# Patient Record
Sex: Male | Born: 1935 | Marital: Single | Smoking: Former smoker
Health system: Southern US, Community
[De-identification: ages and names within clinical notes are randomized; demographics above are authoritative.]

## PROBLEM LIST (undated history)

## (undated) DIAGNOSIS — I6529 Occlusion and stenosis of unspecified carotid artery: Secondary | ICD-10-CM

## (undated) DIAGNOSIS — I714 Abdominal aortic aneurysm, without rupture: Secondary | ICD-10-CM

## (undated) DIAGNOSIS — E785 Hyperlipidemia, unspecified: Secondary | ICD-10-CM

## (undated) DIAGNOSIS — C801 Malignant (primary) neoplasm, unspecified: Secondary | ICD-10-CM

## (undated) DIAGNOSIS — I350 Nonrheumatic aortic (valve) stenosis: Secondary | ICD-10-CM

## (undated) DIAGNOSIS — I2510 Atherosclerotic heart disease of native coronary artery without angina pectoris: Secondary | ICD-10-CM

## (undated) DIAGNOSIS — I10 Essential (primary) hypertension: Secondary | ICD-10-CM

## (undated) DIAGNOSIS — I429 Cardiomyopathy, unspecified: Secondary | ICD-10-CM

## (undated) HISTORY — DX: Abdominal aortic aneurysm, without rupture: I71.4

## (undated) HISTORY — PX: KNEE SURGERY: SHX244

## (undated) HISTORY — DX: Occlusion and stenosis of unspecified carotid artery: I65.29

## (undated) HISTORY — DX: Essential (primary) hypertension: I10

## (undated) HISTORY — DX: Cardiomyopathy, unspecified: I42.9

## (undated) HISTORY — DX: Atherosclerotic heart disease of native coronary artery without angina pectoris: I25.10

## (undated) HISTORY — DX: Nonrheumatic aortic (valve) stenosis: I35.0

## (undated) HISTORY — PX: CARDIAC CATHETERIZATION: SHX172

## (undated) HISTORY — DX: Malignant (primary) neoplasm, unspecified: C80.1

## (undated) HISTORY — DX: Hyperlipidemia, unspecified: E78.5

## (undated) HISTORY — PX: COLONOSCOPY: SHX174

## (undated) HISTORY — PX: CATARACT EXTRACTION: SUR2

## (undated) HISTORY — PX: OTHER SURGICAL HISTORY: SHX169

---

## 2003-03-30 HISTORY — PX: CAROTID ENDARTERECTOMY: SUR193

## 2003-08-22 ENCOUNTER — Other Ambulatory Visit: Payer: Self-pay

## 2006-03-29 HISTORY — PX: CORONARY ARTERY BYPASS GRAFT: SHX141

## 2006-05-12 ENCOUNTER — Ambulatory Visit: Payer: Self-pay | Admitting: Cardiothoracic Surgery

## 2006-07-28 ENCOUNTER — Ambulatory Visit: Payer: Self-pay | Admitting: Cardiothoracic Surgery

## 2008-03-29 DIAGNOSIS — I714 Abdominal aortic aneurysm, without rupture, unspecified: Secondary | ICD-10-CM

## 2008-03-29 HISTORY — DX: Abdominal aortic aneurysm, without rupture: I71.4

## 2008-03-29 HISTORY — DX: Abdominal aortic aneurysm, without rupture, unspecified: I71.40

## 2009-03-06 ENCOUNTER — Encounter: Admission: RE | Admit: 2009-03-06 | Discharge: 2009-03-06 | Payer: Self-pay | Admitting: Neurosurgery

## 2010-08-14 NOTE — Consult Note (Signed)
NAME:  Marcus Jenkins, Marcus Jenkins NO.:  [id number]   MEDICAL RECORD NO.:  [id number]           PATIENT TYPE:   LOCATION:                                 FACILITY:   PHYSICIAN:  Sheliah Plane, MD         DATE OF BIRTH:   DATE OF CONSULTATION:  DATE OF DISCHARGE:                                 CONSULTATION   CARDIAC SURGERY OFFICE CONSULTATION   FOLLOWUP CARDIOLOGIST:  Dr. Domingo Sep.   PRIMARY CARE PHYSICIAN:  Dr. Jerl Mina.   REASON FOR CONSULTATION:  Coronary artery disease.   HISTORY OF PRESENT ILLNESS:  The patient is a 77 year old male who has  recently been followed by Dr. Domingo Sep with a complaint of increasing  fatigue.  He has a past history of hypertension and hyperlipidemia on  statin therapy.  He noted increasing fatigue with exertion over the past  several months.  He attributed this to hydrochlorothiazide and had some  improvement with discontinuation of hydrochlorothiazide.  However,  because of the patient's symptoms, Dr. Domingo Sep continued with a cardiac  workup including a Myoview stress test that showed evidence of moderate,  severe ischemia in the distribution of the right coronary artery.  He  also has exertional dyspnea which is unchanged recently.  He has a long  smoking history.  Because of his positive stress test, cardiac  catheterization was performed by Dr. Jenne Campus at [facility] and he  is now a consideration for angioplasty, was discussed; however, because  of the anatomy, it was not felt that this would be adequate and he is  referred for consideration of bypass surgery.  The patient has a history  of hypertension, history of hyperlipidemia, and denies diabetes.  He is  a smoker and smokes at least a pack a day for the past 50 years.   FAMILY HISTORY:  Both his mother and his father died in their 30s but of  unknown cause.  He notes he has 1 brother who died from emphysema and  alcoholic cirrhosis.  The patient denies any  previous stroke.  Denies  claudication.   PREVIOUS MEDICAL HISTORY:  Includes:  1. Hypertension.  2. Hyperlipidemia.  3. Cerebrovascular disease status post left carotid endarterectomy in      2005.  4. BPH status post TURP many years ago.  5. Chronic low back pain due to disk disease.  6. Continue tobacco abuse.   PREVIOUS SURGERY:  Included a left knee surgery.   SOCIAL HISTORY:  The patient is a retired [employer], is married with  4 children.  Drinks 4 to 5 cups of coffee a day.   MEDICATIONS:  Include:  1. Amlodipine 5 mg daily.  2. Meloxicam 7.5 mg b.i.d.  3. Lisinopril 20 mg daily.  4. Simvastatin 40 mg daily.  5. Glucerna 3 mg nightly.  6. Fish oil.  7. Ibuprofen 800 mg daily.  8. Aspirin 81 mg daily.   ALLERGIES:  Include TARKA CAUSES FATIGUE.  Denies other allergies.   PHYSICAL EXAM:  The patient appears his stated age.  His blood pressure is  144/70.  His pulse is 64 and regular.  Respiratory  rate is 16.  O2 sat is 95%.  The patient appears his staged age, 5 feet, 11 inches tall, weights 198  pounds.  Has no lymphadenopathy.  An old scar on his left knee from an  endarterectomy.  No jugular venous distention.  CARDIAC:  Regular rate and rhythm.  Soft, early systolic murmur at the  apex.  LUNGS:  Clear to auscultation.  His abdominal exam is unremarkable without palpable masses or  organomegaly.  His abdominal aorta does not feel palpably enlarged.  He  has femoral pulses bilaterally.  He has +1 DP and PT pulses bilaterally.  He has chronic venostasis  changes in both lower extremities through about the mid tibia.   LABORATORY FINDINGS:  Show an echocardiogram from April 27, 2006, and  normal LV function.  Ejection fraction is 60% to 65%.  Fibroelastic  degenerative changes of the aortic valve but without aortic stenosis or  aortic insufficiency.  Mild trivial mitral and tricuspid valvular  regurgitation.  Cardiac stress test on April 29, 2006, showed  1 to 1-  1/2 mm ST depression in the lateral inferior leads.  The patient had  evidence of moderate to severe ischemia in the inferior septal basilar,  inferior septal and mid inferior and apical inferior regions consistent  with a distribution in the right coronary.  Cardiac catheterization is  performed with sequential 80% to 90% stenosis of the right coronary  artery, 70% to 80% mid LAD, 40% proximal LAD, 60% posterior descending.  Overall ventricular function was preserved.   IMPRESSION:  Patient with at least significant two-vessel coronary  artery disease if not three with preserved left ventricular function and  a positive stress test.  I have discussed in detail with him to discuss  with the patient the need to stop smoking immediately and have  recommended that we proceed with coronary artery bypass grafting.  He is  willing to do this but prefers to wait a week or two.  He is totally  agreeable to proceeding on February 25.  The risks of surgery including  the risks of death, infection, stroke, myocardial infarction, bleeding,  and blood transfusion are all discussed with the patient in detail.  I  have asked him to stop his ibuprofen now, hold his lisinopril for 48  hours prior to surgery.  He is committed to at least trying to stop  smoking, especially the pulmonary risk in smokers have been discussed  with him in great detail.  He is willing to proceed.      Sheliah Plane, MD  Electronically Signed     EG/MEDQ  D:  05/12/2006  T:  05/12/2006  Job:  [id number]

## 2011-05-20 ENCOUNTER — Other Ambulatory Visit: Payer: Self-pay | Admitting: Gastroenterology

## 2011-05-20 DIAGNOSIS — K5730 Diverticulosis of large intestine without perforation or abscess without bleeding: Secondary | ICD-10-CM

## 2011-06-03 ENCOUNTER — Other Ambulatory Visit: Payer: Self-pay

## 2011-06-10 ENCOUNTER — Other Ambulatory Visit: Payer: Self-pay | Admitting: Gastroenterology

## 2011-06-10 ENCOUNTER — Ambulatory Visit
Admission: RE | Admit: 2011-06-10 | Discharge: 2011-06-10 | Disposition: A | Discharge status: Home / Self Care | Payer: Medicare Other | Source: Ambulatory Visit | Attending: Gastroenterology | Admitting: Gastroenterology

## 2011-06-10 DIAGNOSIS — K5730 Diverticulosis of large intestine without perforation or abscess without bleeding: Secondary | ICD-10-CM

## 2011-12-21 ENCOUNTER — Encounter: Payer: Self-pay | Admitting: Cardiovascular Disease

## 2011-12-21 ENCOUNTER — Ambulatory Visit (INDEPENDENT_AMBULATORY_CARE_PROVIDER_SITE_OTHER): Payer: Medicare Other | Admitting: Cardiovascular Disease

## 2011-12-21 VITALS — BP 138/80 | HR 85 | Ht 72.0 in | Wt 171.2 lb

## 2011-12-21 DIAGNOSIS — E785 Hyperlipidemia, unspecified: Secondary | ICD-10-CM

## 2011-12-21 DIAGNOSIS — R0602 Shortness of breath: Secondary | ICD-10-CM

## 2011-12-21 DIAGNOSIS — I2510 Atherosclerotic heart disease of native coronary artery without angina pectoris: Secondary | ICD-10-CM

## 2011-12-21 DIAGNOSIS — I10 Essential (primary) hypertension: Secondary | ICD-10-CM

## 2011-12-21 DIAGNOSIS — I714 Abdominal aortic aneurysm, without rupture, unspecified: Secondary | ICD-10-CM

## 2011-12-21 MED ORDER — LISINOPRIL 20 MG PO TABS: 20.0000 mg | ORAL_TABLET | Freq: Every day | ORAL | Status: DC

## 2011-12-21 NOTE — Patient Instructions (Addendum)
Stop Felodipine.  Start Lisinopril 20 mg once daily.  Labs to be done in 1 week.   Your physician has requested that you have an echocardiogram. Echocardiography is a painless test that uses sound waves to create images of your heart. It provides your doctor with information about the size and shape of your heart and how well your heart's chambers and valves are working. This procedure takes approximately one hour. There are no restrictions for this procedure.  Your physician has requested that you have an abdominal aorta duplex. During this test, an ultrasound is used to evaluate the aorta. Allow 30 minutes for this exam. Do not eat after midnight the day before and avoid carbonated beverages   Follow up after tests.

## 2011-12-27 ENCOUNTER — Other Ambulatory Visit: Payer: Self-pay

## 2011-12-27 ENCOUNTER — Other Ambulatory Visit (INDEPENDENT_AMBULATORY_CARE_PROVIDER_SITE_OTHER): Payer: Medicare Other

## 2011-12-27 DIAGNOSIS — R0602 Shortness of breath: Secondary | ICD-10-CM

## 2011-12-27 DIAGNOSIS — I359 Nonrheumatic aortic valve disorder, unspecified: Secondary | ICD-10-CM

## 2011-12-30 ENCOUNTER — Encounter (INDEPENDENT_AMBULATORY_CARE_PROVIDER_SITE_OTHER): Payer: Medicare Other

## 2011-12-30 DIAGNOSIS — I714 Abdominal aortic aneurysm, without rupture: Secondary | ICD-10-CM

## 2011-12-31 ENCOUNTER — Encounter: Payer: Self-pay | Admitting: Cardiovascular Disease

## 2011-12-31 DIAGNOSIS — R0602 Shortness of breath: Secondary | ICD-10-CM | POA: Insufficient documentation

## 2011-12-31 DIAGNOSIS — I714 Abdominal aortic aneurysm, without rupture: Secondary | ICD-10-CM | POA: Insufficient documentation

## 2011-12-31 DIAGNOSIS — E785 Hyperlipidemia, unspecified: Secondary | ICD-10-CM | POA: Insufficient documentation

## 2011-12-31 DIAGNOSIS — I10 Essential (primary) hypertension: Secondary | ICD-10-CM | POA: Insufficient documentation

## 2011-12-31 DIAGNOSIS — I2510 Atherosclerotic heart disease of native coronary artery without angina pectoris: Secondary | ICD-10-CM | POA: Insufficient documentation

## 2011-12-31 NOTE — Assessment & Plan Note (Signed)
The patient is having increased exertional dyspnea without chest pain. He has a cardiac murmur suggestive of aortic stenosis which does not seem to be severe by cardiac exam. However, given the change in his symptoms, I recommend an echocardiogram.

## 2011-12-31 NOTE — Progress Notes (Signed)
HPI  This is a 78 year old male who is here today to establish cardiovascular care. He is transferring from the [place] [facility]. He has known history of coronary artery disease status post two-vessel CABG in 2008 at [facility]. He is also known to have small to moderate size abdominal aortic aneurysm, previous left carotid endarterectomy, hypertension, hyperlipidemia and tobacco use. He also has known history of left bundle branch block with intermittent second degree Mobitz type I.  He denies any chest pain. However, he noticed increased exertional dyspnea as well as lower extremity edema. He denies dizziness, syncope or presyncope. Most recent echocardiogram was in February of 2011 which showed normal LV systolic function with mildly calcified aortic valve.  Allergies  Allergen Reactions  . Tarka (Trandolapril-Verapamil Hcl Er)      Current Outpatient Prescriptions on File Prior to Visit  Medication Sig Dispense Refill  . rosuvastatin (CRESTOR) 20 MG tablet Take 20 mg by mouth daily.      Marland Kitchen zolpidem (AMBIEN) 10 MG tablet Take 10 mg by mouth daily.      Marland Kitchen lisinopril (PRINIVIL,ZESTRIL) 20 MG tablet Take 1 tablet (20 mg total) by mouth daily.  60 tablet  6     Past Medical History  Diagnosis Date  . Carotid artery occlusion   . Coronary artery disease   . Hyperlipidemia   . AAA (abdominal aortic aneurysm) 2010  . Hypertension      Past Surgical History  Procedure Date  . Cardiac catheterization   . Carotid endarterectomy 2005    Dr. Michela Pitcher  . Knee surgery     left  . Cataract extraction   . Colonoscopy   . Surgical injection spine   . Burn facial cancer   . Coronary artery bypass graft 2008    [facility] x2: LIMA to LAD and SVG to RCA.     History reviewed. No pertinent family history.   History   Social History  . Marital Status: Single    Spouse Name: N/A    Number of Children: N/A  . Years of Education: N/A   Occupational History  . Not on file.    Social History Main Topics  . Smoking status: Current Every Day Smoker -- 0.5 packs/day for 70 years    Types: Cigarettes  . Smokeless tobacco: Not on file  . Alcohol Use: No  . Drug Use: No  . Sexually Active:    Other Topics Concern  . Not on file   Social History Narrative  . No narrative on file     ROS Constitutional: Negative for fever, chills, diaphoresis, activity change, appetite change and fatigue.  HENT: Negative for hearing loss, nosebleeds, congestion, sore throat, facial swelling, drooling, trouble swallowing, neck pain, voice change, sinus pressure and tinnitus.  Eyes: Negative for photophobia, pain, discharge and visual disturbance.  Respiratory: Negative for apnea, cough, chest tightness and wheezing.  Cardiovascular: Negative for chest pain, palpitations.  Gastrointestinal: Negative for nausea, vomiting, abdominal pain, diarrhea, constipation, blood in stool and abdominal distention.  Genitourinary: Negative for dysuria, urgency, frequency, hematuria and decreased urine volume.  Musculoskeletal: Negative for myalgias, back pain, joint swelling, arthralgias and gait problem.  Skin: Negative for color change, pallor, rash and wound.  Neurological: Negative for dizziness, tremors, seizures, syncope, speech difficulty, weakness, light-headedness, numbness and headaches.  Psychiatric/Behavioral: Negative for suicidal ideas, hallucinations, behavioral problems and agitation. The patient is not nervous/anxious.     PHYSICAL EXAM   BP 138/80  Pulse 85  Ht 6' (1.829 m)  Wt 171 lb 4 oz (77.678 kg)  BMI 23.23 kg/m2 Constitutional: He is oriented to person, place, and time. He appears well-developed and well-nourished. No distress.  HENT: No nasal discharge.  Head: Normocephalic and atraumatic.  Eyes: Pupils are equal and round. Right eye exhibits no discharge. Left eye exhibits no discharge.  Neck: Normal range of motion. Neck supple. No JVD present. No  thyromegaly present.  Cardiovascular: Normal rate, regular rhythm, normal heart sounds and. Exam reveals no gallop and no friction rub. There is a 2/6 systolic ejection murmur at the aortic area with preserved S2.  Pulmonary/Chest: Effort normal and breath sounds normal. No stridor. No respiratory distress. He has no wheezes. He has no rales. He exhibits no tenderness.  Abdominal: Soft. Bowel sounds are normal. He exhibits no distension. There is no tenderness. There is no rebound and no guarding.  Musculoskeletal: Normal range of motion. He exhibits +1 edema and no tenderness.  Neurological: He is alert and oriented to person, place, and time. Coordination normal.  Skin: Skin is warm and dry. No rash noted. He is not diaphoretic. No erythema. No pallor.  Psychiatric: He has a normal mood and affect. His behavior is normal. Judgment and thought content normal.       ZOX:WRUEA  Rhythm  -Left bundle branch block.   ABNORMAL    ASSESSMENT AND PLAN

## 2011-12-31 NOTE — Assessment & Plan Note (Signed)
The patient's blood pressure seems to be reasonable but we might have to be more aggressive with this due to abdominal aortic aneurysm. He is also having lower extremity edema which is likely due to calcium channel blocker. Thus, I recommend switching him from felodipine to lisinopril 20 mg once daily. Recommend basic metabolic profile in one week. Side effects were explained to him. It appears that he was started at some point a small dose carvedilol if he is no longer on the. That might be related to his conduction disease.

## 2011-12-31 NOTE — Assessment & Plan Note (Signed)
Continue treatment with Crestor. Target LDL is less than 70. 

## 2011-12-31 NOTE — Assessment & Plan Note (Signed)
No symptoms suggestive of angina. Continue medical therapy. 

## 2011-12-31 NOTE — Assessment & Plan Note (Signed)
This was small to moderate in size. No recent evaluation. I recommend a followup ultrasound.

## 2012-01-10 ENCOUNTER — Ambulatory Visit (INDEPENDENT_AMBULATORY_CARE_PROVIDER_SITE_OTHER): Payer: Medicare Other | Admitting: Cardiovascular Disease

## 2012-01-10 ENCOUNTER — Encounter: Payer: Self-pay | Admitting: Cardiovascular Disease

## 2012-01-10 VITALS — BP 118/64 | HR 87 | Ht 72.0 in | Wt 170.2 lb

## 2012-01-10 DIAGNOSIS — I714 Abdominal aortic aneurysm, without rupture: Secondary | ICD-10-CM

## 2012-01-10 DIAGNOSIS — I350 Nonrheumatic aortic (valve) stenosis: Secondary | ICD-10-CM

## 2012-01-10 DIAGNOSIS — I359 Nonrheumatic aortic valve disorder, unspecified: Secondary | ICD-10-CM

## 2012-01-10 DIAGNOSIS — R0602 Shortness of breath: Secondary | ICD-10-CM

## 2012-01-10 DIAGNOSIS — I2510 Atherosclerotic heart disease of native coronary artery without angina pectoris: Secondary | ICD-10-CM

## 2012-01-10 DIAGNOSIS — I10 Essential (primary) hypertension: Secondary | ICD-10-CM

## 2012-01-10 MED ORDER — LOSARTAN POTASSIUM 50 MG PO TABS: 50.0000 mg | ORAL_TABLET | Freq: Every day | ORAL | Status: DC

## 2012-01-10 NOTE — Patient Instructions (Addendum)
Labs today.  Stop taking Lisinopril.  Start Losartan 50 mg once daily.  Follow up in 6 months.

## 2012-01-11 LAB — BASIC METABOLIC PANEL
BUN/Creatinine Ratio: 14 (ref 10–22)
BUN: 15 mg/dL (ref 8–27)
CO2: 20 mmol/L (ref 19–28)
Chloride: 100 mmol/L (ref 97–108)
Glucose: 81 mg/dL (ref 65–99)
Potassium: 5.1 mmol/L (ref 3.5–5.2)

## 2012-01-13 ENCOUNTER — Encounter: Payer: Self-pay | Admitting: *Deleted

## 2012-01-18 ENCOUNTER — Encounter: Payer: Self-pay | Admitting: Cardiovascular Disease

## 2012-01-18 DIAGNOSIS — I350 Nonrheumatic aortic (valve) stenosis: Secondary | ICD-10-CM | POA: Insufficient documentation

## 2012-01-18 NOTE — Progress Notes (Signed)
HPI  This is a 78 year old male who is here today for a followup visit. He recently transferred care from the [facility] on heart and vascular Center. He has known history of coronary artery disease status post two-vessel CABG in 2008 at [facility]. He is also known to have small to moderate size abdominal aortic aneurysm, previous left carotid endarterectomy, hypertension, hyperlipidemia and tobacco use. He also has known history of left bundle branch block with intermittent second degree Mobitz type I.  He denies any chest pain. However, he noticed increased exertional dyspnea as well as lower extremity edema. He denies dizziness, syncope or presyncope. He did not tolerate treatment with carvedilol in the past due to worsening dyspnea and fatigue. He underwent an echocardiogram which showed mildly reduced LV systolic function with an EF of 40-45% with mild to moderate aortic stenosis. Aortic aneurysm was found to be 4.1 cm by duplex ultrasound. He denies any abdominal pain. During her last visit, I switched him from felodipine to lisinopril 20 mg once daily due to lower extremity edema. The edema resolved. However, he started having dry cough since starting the medication.  Allergies  Allergen Reactions  . Tarka (Trandolapril-Verapamil Hcl Er)      Current Outpatient Prescriptions on File Prior to Visit  Medication Sig Dispense Refill  . aspirin 81 MG tablet Take 81 mg by mouth daily.      . Cholecalciferol (VITAMIN D-3 PO) Take 400 Units by mouth daily.      . Coenzyme Q10 (CO Q 10) 100 MG CAPS Take by mouth daily.      Marland Kitchen HYDROcodone-acetaminophen (NORCO) 10-325 MG per tablet Take 1 tablet by mouth 3 (three) times daily.      . Multiple Vitamins-Minerals (CENTRUM SILVER PO) Take by mouth daily.      . Niacin (VITAMIN B-3 PO) Take by mouth daily.      . Omega-3 Fatty Acids (FISH OIL PO) Take by mouth daily.      . rosuvastatin (CRESTOR) 20 MG tablet Take 20 mg by mouth daily.      Marland Kitchen zolpidem  (AMBIEN) 10 MG tablet Take 10 mg by mouth daily.      Marland Kitchen losartan (COZAAR) 50 MG tablet Take 1 tablet (50 mg total) by mouth daily.  30 tablet  9     Past Medical History  Diagnosis Date  . Carotid artery occlusion   . Coronary artery disease   . Hyperlipidemia   . AAA (abdominal aortic aneurysm) 2010  . Hypertension   . Aortic stenosis     Mild to moderate with a mean gradient of 14 mm Hg and valve area of 1.1 (October of 2013)  . Cardiomyopathy     EF 40-45%     Past Surgical History  Procedure Date  . Cardiac catheterization   . Carotid endarterectomy 2005    Dr. Michela Pitcher  . Knee surgery     left  . Cataract extraction   . Colonoscopy   . Surgical injection spine   . Burn facial cancer   . Coronary artery bypass graft 2008    [facility] x2: LIMA to LAD and SVG to RCA.     History reviewed. No pertinent family history.   History   Social History  . Marital Status: Single    Spouse Name: N/A    Number of Children: N/A  . Years of Education: N/A   Occupational History  . Not on file.   Social History Main Topics  .  Smoking status: Current Every Day Smoker -- 0.5 packs/day for 70 years    Types: Cigarettes  . Smokeless tobacco: Not on file  . Alcohol Use: No  . Drug Use: No  . Sexually Active:    Other Topics Concern  . Not on file   Social History Narrative  . No narrative on file      PHYSICAL EXAM   BP 118/64  Pulse 87  Ht 6' (1.829 m)  Wt 170 lb 4 oz (77.225 kg)  BMI 23.09 kg/m2 Constitutional: He is oriented to person, place, and time. He appears well-developed and well-nourished. No distress.  HENT: No nasal discharge.  Head: Normocephalic and atraumatic.  Eyes: Pupils are equal and round. Right eye exhibits no discharge. Left eye exhibits no discharge.  Neck: Normal range of motion. Neck supple. No JVD present. No thyromegaly present.  Cardiovascular: Normal rate, regular rhythm, normal heart sounds and. Exam reveals no gallop and no friction  rub. There is a 2/6 systolic ejection murmur at the aortic area with preserved S2.  Pulmonary/Chest: Effort normal and breath sounds normal. No stridor. No respiratory distress. He has no wheezes. He has no rales. He exhibits no tenderness.  Abdominal: Soft. Bowel sounds are normal. He exhibits no distension. There is no tenderness. There is no rebound and no guarding.  Musculoskeletal: Normal range of motion. He exhibits +1 edema and no tenderness.  Neurological: He is alert and oriented to person, place, and time. Coordination normal.  Skin: Skin is warm and dry. No rash noted. He is not diaphoretic. No erythema. No pallor.  Psychiatric: He has a normal mood and affect. His behavior is normal. Judgment and thought content normal.        ASSESSMENT AND PLAN

## 2012-01-18 NOTE — Assessment & Plan Note (Signed)
His blood pressure is well controlled and lisinopril. However, he is having dry cough. Thus, I will switch him to losartan 50 mg once daily. I will check basic metabolic profile today.

## 2012-01-18 NOTE — Assessment & Plan Note (Signed)
This is likely multifactorial due to mild cardiomyopathy and some degree of aortic stenosis. He did not tolerate Coreg in the past possibly due to his conduction disease.

## 2012-01-18 NOTE — Assessment & Plan Note (Signed)
No symptoms suggestive of angina. Continue medical therapy. 

## 2012-01-18 NOTE — Assessment & Plan Note (Signed)
This is mild to moderate by echocardiogram. I recommend a repeat echocardiogram in one year. The mean gradient was not high. However, the valve area was lower than expected.

## 2012-01-18 NOTE — Assessment & Plan Note (Addendum)
This is still small to moderate in size at 4.1 cm. I recommend a followup study in one year. The iliac arteries were mildly dilated but not aneurysmal.

## 2012-03-10 ENCOUNTER — Other Ambulatory Visit: Payer: Self-pay | Admitting: Oncology

## 2012-03-17 ENCOUNTER — Ambulatory Visit (INDEPENDENT_AMBULATORY_CARE_PROVIDER_SITE_OTHER): Payer: Medicare Other | Admitting: Cardiovascular Disease

## 2012-03-17 ENCOUNTER — Encounter: Payer: Self-pay | Admitting: Cardiovascular Disease

## 2012-03-17 VITALS — BP 130/78 | HR 76 | Ht 69.0 in | Wt 169.2 lb

## 2012-03-17 DIAGNOSIS — R0602 Shortness of breath: Secondary | ICD-10-CM

## 2012-03-17 DIAGNOSIS — I10 Essential (primary) hypertension: Secondary | ICD-10-CM

## 2012-03-17 DIAGNOSIS — I350 Nonrheumatic aortic (valve) stenosis: Secondary | ICD-10-CM

## 2012-03-17 DIAGNOSIS — I359 Nonrheumatic aortic valve disorder, unspecified: Secondary | ICD-10-CM

## 2012-03-17 DIAGNOSIS — I2510 Atherosclerotic heart disease of native coronary artery without angina pectoris: Secondary | ICD-10-CM

## 2012-03-17 NOTE — Patient Instructions (Addendum)
Continue same medications.  Follow up in 3 months.  

## 2012-03-17 NOTE — Assessment & Plan Note (Signed)
This is likely multifactorial due to lung cancer, mild cardiomyopathy and some degree of aortic stenosis. I do not see significant change in his cardiac status. He has chronic left bundle branch block.

## 2012-03-17 NOTE — Assessment & Plan Note (Signed)
Blood pressure is well-controlled on losartan.  

## 2012-03-17 NOTE — Assessment & Plan Note (Signed)
This is mild to moderate by echocardiogram. Further surveillance would be determined based on his response to treatment of stage IV lung cancer.

## 2012-03-17 NOTE — Assessment & Plan Note (Signed)
No symptoms of chest pain. Continue medical therapy.

## 2012-03-17 NOTE — Progress Notes (Signed)
HPI  This is a 78 year old male who is here today for a followup visit. He has known history of coronary artery disease status post two-vessel CABG in 2008 at [facility]. He is also known to have small to moderate size abdominal aortic aneurysm, previous left carotid endarterectomy, hypertension, hyperlipidemia and tobacco use. He also has known history of left bundle branch block with intermittent second degree Mobitz type I.  He did not tolerate treatment with carvedilol in the past due to worsening dyspnea and fatigue. Recent echocardiogram showed mildly reduced LV systolic function with an EF of 40-45% with mild to moderate aortic stenosis. Aortic aneurysm was found to be 4.1 cm by duplex ultrasound.  Since his last visit, he was diagnosed with stage IV squamous cell carcinoma of the lung. He was initiated on chemotherapy by Dr. Orlie Dakin. The patient complains of being more fatigued than before with slight shortness of breath. He has no chest pain. No orthopnea, PND or lower extremity edema.  Allergies  Allergen Reactions  . Tarka (Trandolapril-Verapamil Hcl Er)      Current Outpatient Prescriptions on File Prior to Visit  Medication Sig Dispense Refill  . aspirin 81 MG tablet Take 81 mg by mouth daily.      . Cholecalciferol (VITAMIN D-3 PO) Take 400 Units by mouth daily.      . Coenzyme Q10 (CO Q 10) 100 MG CAPS Take by mouth daily.      Marland Kitchen HYDROcodone-acetaminophen (NORCO) 10-325 MG per tablet Take 1 tablet by mouth 3 (three) times daily.      Marland Kitchen losartan (COZAAR) 50 MG tablet Take 1 tablet (50 mg total) by mouth daily.  30 tablet  9  . Multiple Vitamins-Minerals (CENTRUM SILVER PO) Take by mouth daily.      . Niacin (VITAMIN B-3 PO) Take by mouth daily.      . Omega-3 Fatty Acids (FISH OIL PO) Take by mouth daily.      . rosuvastatin (CRESTOR) 20 MG tablet Take 20 mg by mouth daily.      Marland Kitchen zolpidem (AMBIEN) 10 MG tablet Take 10 mg by mouth daily.         Past Medical History    Diagnosis Date  . Carotid artery occlusion   . Coronary artery disease   . Hyperlipidemia   . AAA (abdominal aortic aneurysm) 2010  . Hypertension   . Aortic stenosis     Mild to moderate with a mean gradient of 14 mm Hg and valve area of 1.1 (October of 2013)  . Cardiomyopathy     EF 40-45%  . Cancer     lung cancer     Past Surgical History  Procedure Date  . Cardiac catheterization   . Carotid endarterectomy 2005    Dr. Michela Pitcher  . Knee surgery     left  . Cataract extraction   . Colonoscopy   . Surgical injection spine   . Burn facial cancer   . Coronary artery bypass graft 2008    [facility] x2: LIMA to LAD and SVG to RCA.  Marland Kitchen Porta cath     CHEST     History reviewed. No pertinent family history.   History   Social History  . Marital Status: Single    Spouse Name: N/A    Number of Children: N/A  . Years of Education: N/A   Occupational History  . Not on file.   Social History Main Topics  . Smoking status: Current Every Day  Smoker -- 0.5 packs/day for 70 years    Types: Cigarettes  . Smokeless tobacco: Not on file  . Alcohol Use: No  . Drug Use: No  . Sexually Active:    Other Topics Concern  . Not on file   Social History Narrative  . No narrative on file      PHYSICAL EXAM   BP 130/78  Pulse 76  Ht 5\' 9"  (1.753 m)  Wt 169 lb 4 oz (76.771 kg)  BMI 24.99 kg/m2 Constitutional: He is oriented to person, place, and time. He appears well-developed and well-nourished. No distress.  HENT: No nasal discharge.  Head: Normocephalic and atraumatic.  Eyes: Pupils are equal and round. Right eye exhibits no discharge. Left eye exhibits no discharge.  Neck: Normal range of motion. Neck supple. No JVD present. No thyromegaly present.  Cardiovascular: Normal rate, regular rhythm, normal heart sounds and. Exam reveals no gallop and no friction rub. There is a 2/6 systolic ejection murmur at the aortic area with preserved S2.  Pulmonary/Chest: Effort normal and  breath sounds normal. No stridor. No respiratory distress. He has no wheezes. He has no rales. He exhibits no tenderness.  Abdominal: Soft. Bowel sounds are normal. He exhibits no distension. There is no tenderness. There is no rebound and no guarding.  Musculoskeletal: Normal range of motion. He exhibits trace edema and no tenderness.  Neurological: He is alert and oriented to person, place, and time. Coordination normal.  Skin: Skin is warm and dry. No rash noted. He is not diaphoretic. No erythema. No pallor.  Psychiatric: He has a normal mood and affect. His behavior is normal. Judgment and thought content normal.     EKG: Sinus  Rhythm with first degree AV block -Left bundle branch block.   ABNORMAL     ASSESSMENT AND PLAN

## 2012-05-13 ENCOUNTER — Other Ambulatory Visit: Payer: Self-pay

## 2012-07-17 ENCOUNTER — Encounter: Payer: Self-pay | Admitting: Cardiovascular Disease

## 2012-07-17 ENCOUNTER — Ambulatory Visit (INDEPENDENT_AMBULATORY_CARE_PROVIDER_SITE_OTHER): Payer: Medicare Other | Admitting: Cardiovascular Disease

## 2012-07-17 ENCOUNTER — Ambulatory Visit: Payer: Medicare Other | Admitting: Cardiovascular Disease

## 2012-07-17 VITALS — BP 112/80 | HR 80 | Ht 69.0 in | Wt 165.5 lb

## 2012-07-17 DIAGNOSIS — I2510 Atherosclerotic heart disease of native coronary artery without angina pectoris: Secondary | ICD-10-CM

## 2012-07-17 DIAGNOSIS — I350 Nonrheumatic aortic (valve) stenosis: Secondary | ICD-10-CM

## 2012-07-17 DIAGNOSIS — I10 Essential (primary) hypertension: Secondary | ICD-10-CM

## 2012-07-17 DIAGNOSIS — I714 Abdominal aortic aneurysm, without rupture: Secondary | ICD-10-CM

## 2012-07-17 DIAGNOSIS — I359 Nonrheumatic aortic valve disorder, unspecified: Secondary | ICD-10-CM

## 2012-07-17 NOTE — Progress Notes (Signed)
HPI  This is a 79 year old male who is here today for a followup visit. He has known history of coronary artery disease status post two-vessel CABG in 2008 at [facility]. He is also known to have small to moderate size abdominal aortic aneurysm, previous left carotid endarterectomy, hypertension, hyperlipidemia and tobacco use. He has left bundle branch block with intermittent second degree Mobitz type I.  He did not tolerate treatment with carvedilol in the past due to worsening dyspnea and fatigue. Most recent echocardiogram in September of 2013 showed reduced LV systolic function with an EF of 40-45% with mild to moderate aortic stenosis. Aortic aneurysm was found to be 4.1 cm by duplex ultrasound.  He continues to receive chemotherapy for stage IV squamous cell carcinoma of the lung.  He has been doing reasonably well. He denies any chest pain. He does feel tired and fatigue.  Allergies  Allergen Reactions  . Tarka (Trandolapril-Verapamil Hcl Er)      Current Outpatient Prescriptions on File Prior to Visit  Medication Sig Dispense Refill  . aspirin 81 MG tablet Take 81 mg by mouth daily.      . Cholecalciferol (VITAMIN D-3 PO) Take 400 Units by mouth daily.      . Coenzyme Q10 (CO Q 10) 100 MG CAPS Take by mouth daily.      Marland Kitchen HYDROcodone-acetaminophen (NORCO) 10-325 MG per tablet Take 1 tablet by mouth 3 (three) times daily.      Marland Kitchen losartan (COZAAR) 50 MG tablet Take 1 tablet (50 mg total) by mouth daily.  30 tablet  9  . Multiple Vitamins-Minerals (CENTRUM SILVER PO) Take by mouth daily.      . rosuvastatin (CRESTOR) 20 MG tablet Take 20 mg by mouth daily.      Marland Kitchen zolpidem (AMBIEN) 10 MG tablet Take 10 mg by mouth daily.       No current facility-administered medications on file prior to visit.     Past Medical History  Diagnosis Date  . Carotid artery occlusion   . Coronary artery disease   . Hyperlipidemia   . AAA (abdominal aortic aneurysm) 2010  . Hypertension   . Aortic  stenosis     Mild to moderate with a mean gradient of 14 mm Hg and valve area of 1.1 (October of 2013)  . Cardiomyopathy     EF 40-45%  . Cancer     lung cancer     Past Surgical History  Procedure Laterality Date  . Cardiac catheterization    . Carotid endarterectomy  2005    Dr. Michela Pitcher  . Knee surgery      left  . Cataract extraction    . Colonoscopy    . Surgical injection spine    . Burn facial cancer    . Coronary artery bypass graft  2008    [facility] x2: LIMA to LAD and SVG to RCA.  Marland Kitchen Porta cath      CHEST     History reviewed. No pertinent family history.   History   Social History  . Marital Status: Single    Spouse Name: N/A    Number of Children: N/A  . Years of Education: N/A   Occupational History  . Not on file.   Social History Main Topics  . Smoking status: Current Every Day Smoker -- 0.50 packs/day for 70 years    Types: Cigarettes  . Smokeless tobacco: Not on file  . Alcohol Use: No  . Drug Use:  No  . Sexually Active:    Other Topics Concern  . Not on file   Social History Narrative  . No narrative on file      PHYSICAL EXAM   BP 112/80  Pulse 80  Ht 5\' 9"  (1.753 m)  Wt 165 lb 8 oz (75.07 kg)  BMI 24.43 kg/m2 Constitutional: He is oriented to person, place, and time. He appears well-developed and well-nourished. No distress.  HENT: No nasal discharge.  Head: Normocephalic and atraumatic.  Eyes: Pupils are equal and round. Right eye exhibits no discharge. Left eye exhibits no discharge.  Neck: Normal range of motion. Neck supple. No JVD present. No thyromegaly present.  Cardiovascular: Normal rate, regular rhythm, normal heart sounds and. Exam reveals no gallop and no friction rub. There is a 2/6 systolic ejection murmur at the aortic area with preserved S2.  Pulmonary/Chest: Effort normal and breath sounds normal. No stridor. No respiratory distress. He has no wheezes. He has no rales. He exhibits no tenderness.  Abdominal: Soft.  Bowel sounds are normal. He exhibits no distension. There is no tenderness. There is no rebound and no guarding.  Musculoskeletal: Normal range of motion. He exhibits trace edema and no tenderness.  Neurological: He is alert and oriented to person, place, and time. Coordination normal.  Skin: Skin is warm and dry. No rash noted. He is not diaphoretic. No erythema. No pallor.  Psychiatric: He has a normal mood and affect. His behavior is normal. Judgment and thought content normal.     EKG: Sinus  Rhythm with first degree AV block -Left bundle branch block.   ABNORMAL     ASSESSMENT AND PLAN

## 2012-07-17 NOTE — Assessment & Plan Note (Signed)
He is stable with no symptoms suggestive of angina. Continue medical therapy. 

## 2012-07-17 NOTE — Assessment & Plan Note (Signed)
He is scheduled for aortic duplex ultrasound in May of this year to ensure stability. Further surveillance will be determined based on his response to treatment to cancer.

## 2012-07-17 NOTE — Assessment & Plan Note (Signed)
Blood pressure is well controlled on current medications. 

## 2012-07-17 NOTE — Assessment & Plan Note (Signed)
This is mild to moderate by echocardiogram in September of 2013. Further surveillance would be determined based on his response to treatment of stage IV lung cancer.

## 2012-07-17 NOTE — Patient Instructions (Addendum)
Continue same medications.  Follow up in 6 months.  

## 2012-08-02 ENCOUNTER — Encounter (INDEPENDENT_AMBULATORY_CARE_PROVIDER_SITE_OTHER): Payer: Medicare Other

## 2012-08-02 DIAGNOSIS — I714 Abdominal aortic aneurysm, without rupture: Secondary | ICD-10-CM

## 2012-08-02 DIAGNOSIS — I700 Atherosclerosis of aorta: Secondary | ICD-10-CM

## 2012-11-01 ENCOUNTER — Other Ambulatory Visit: Payer: Self-pay

## 2013-01-03 ENCOUNTER — Ambulatory Visit (INDEPENDENT_AMBULATORY_CARE_PROVIDER_SITE_OTHER): Payer: Medicare Other | Admitting: Cardiovascular Disease

## 2013-01-03 ENCOUNTER — Encounter: Payer: Self-pay | Admitting: Cardiovascular Disease

## 2013-01-03 VITALS — BP 130/82 | HR 71 | Ht 69.0 in | Wt 165.5 lb

## 2013-01-03 DIAGNOSIS — I714 Abdominal aortic aneurysm, without rupture, unspecified: Secondary | ICD-10-CM

## 2013-01-03 DIAGNOSIS — I350 Nonrheumatic aortic (valve) stenosis: Secondary | ICD-10-CM

## 2013-01-03 DIAGNOSIS — I2510 Atherosclerotic heart disease of native coronary artery without angina pectoris: Secondary | ICD-10-CM

## 2013-01-03 DIAGNOSIS — R0602 Shortness of breath: Secondary | ICD-10-CM

## 2013-01-03 DIAGNOSIS — N529 Male erectile dysfunction, unspecified: Secondary | ICD-10-CM | POA: Insufficient documentation

## 2013-01-03 DIAGNOSIS — I359 Nonrheumatic aortic valve disorder, unspecified: Secondary | ICD-10-CM

## 2013-01-03 MED ORDER — SILDENAFIL CITRATE 25 MG PO TABS: 25.0000 mg | ORAL_TABLET | Freq: Every day | ORAL | Status: DC | PRN

## 2013-01-03 NOTE — Progress Notes (Signed)
HPI  This is a 79 year old male who is here today for a followup visit. He has known history of coronary artery disease status post two-vessel CABG in 2008 at [facility]. He is also known to have small to moderate size abdominal aortic aneurysm, previous left carotid endarterectomy, hypertension, hyperlipidemia, mild to moderate aortic stenosis and tobacco use. He has left bundle branch block with intermittent second degree Mobitz type I.  He did not tolerate treatment with carvedilol in the past due to worsening dyspnea and fatigue. Most recent echocardiogram in September of 2013 showed reduced LV systolic function with an EF of 40-45% with mild to moderate aortic stenosis. Aortic aneurysm was found to be 4.1 cm by duplex ultrasound.  He continues to receive chemotherapy for stage IV squamous cell carcinoma of the lung.  He has been doing reasonably well. He denies any chest pain. Shortness of breath is at baseline.  Allergies  Allergen Reactions  . Tarka [Trandolapril-Verapamil Hcl Er]      Current Outpatient Prescriptions on File Prior to Visit  Medication Sig Dispense Refill  . aspirin 81 MG tablet Take 81 mg by mouth daily.      . Cholecalciferol (VITAMIN D-3 PO) Take 400 Units by mouth daily.      . Coenzyme Q10 (CO Q 10) 100 MG CAPS Take by mouth daily.      Marland Kitchen Dextromethorphan-Guaifenesin (VICKS DAYQUIL MUCUS CONTROL DM PO) Take by mouth as needed.      Boris Lown Oil 500 MG CAPS Take by mouth daily.      Marland Kitchen losartan (COZAAR) 50 MG tablet Take 1 tablet (50 mg total) by mouth daily.  30 tablet  9  . Multiple Vitamins-Minerals (CENTRUM SILVER PO) Take by mouth daily.      Marland Kitchen oxyCODONE (OXYCONTIN) 10 MG 12 hr tablet Take 10 mg by mouth as needed for pain.      . rosuvastatin (CRESTOR) 20 MG tablet Take 20 mg by mouth daily.      Marland Kitchen zolpidem (AMBIEN) 10 MG tablet Take 10 mg by mouth daily.       No current facility-administered medications on file prior to visit.     Past Medical History    Diagnosis Date  . Carotid artery occlusion   . Coronary artery disease   . Hyperlipidemia   . AAA (abdominal aortic aneurysm) 2010  . Hypertension   . Aortic stenosis     Mild to moderate with a mean gradient of 14 mm Hg and valve area of 1.1 (October of 2013)  . Cardiomyopathy     EF 40-45%  . Cancer     lung cancer     Past Surgical History  Procedure Laterality Date  . Cardiac catheterization    . Carotid endarterectomy  2005    Dr. Michela Pitcher  . Knee surgery      left  . Cataract extraction    . Colonoscopy    . Surgical injection spine    . Burn facial cancer    . Coronary artery bypass graft  2008    [facility] x2: LIMA to LAD and SVG to RCA.  Marland Kitchen Porta cath      CHEST     Family History  Problem Relation Age of Onset  . Family history unknown: Yes     History   Social History  . Marital Status: Single    Spouse Name: N/A    Number of Children: N/A  . Years of Education: N/A  Occupational History  . Not on file.   Social History Main Topics  . Smoking status: Current Every Day Smoker -- 0.50 packs/day for 70 years    Types: Cigarettes  . Smokeless tobacco: Not on file  . Alcohol Use: No  . Drug Use: No  . Sexual Activity: Not on file   Other Topics Concern  . Not on file   Social History Narrative  . No narrative on file      PHYSICAL EXAM   BP 130/82  Pulse 71  Ht 5\' 9"  (1.753 m)  Wt 165 lb 8 oz (75.07 kg)  BMI 24.43 kg/m2 Constitutional: He is oriented to person, place, and time. He appears well-developed and well-nourished. No distress.  HENT: No nasal discharge.  Head: Normocephalic and atraumatic.  Eyes: Pupils are equal and round. Right eye exhibits no discharge. Left eye exhibits no discharge.  Neck: Normal range of motion. Neck supple. No JVD present. No thyromegaly present.  Cardiovascular: Normal rate, regular rhythm, normal heart sounds and. Exam reveals no gallop and no friction rub. There is a 2/6 systolic ejection murmur at the  aortic area with preserved S2.  Pulmonary/Chest: Effort normal and breath sounds normal. No stridor. No respiratory distress. He has no wheezes. He has no rales. He exhibits no tenderness.  Abdominal: Soft. Bowel sounds are normal. He exhibits no distension. There is no tenderness. There is no rebound and no guarding.  Musculoskeletal: Normal range of motion. He exhibits trace edema and no tenderness.  Neurological: He is alert and oriented to person, place, and time. Coordination normal.  Skin: Skin is warm and dry. No rash noted. He is not diaphoretic. No erythema. No pallor.  Psychiatric: He has a normal mood and affect. His behavior is normal. Judgment and thought content normal.     EKG: Sinus rhythm with frequent PACs and PVCs, left ventricular hypertrophy with QRS widening   ASSESSMENT AND PLAN

## 2013-01-03 NOTE — Assessment & Plan Note (Signed)
He is stable from a cardiac standpoint with no symptoms suggestive of angina. Continue medical therapy. 

## 2013-01-03 NOTE — Assessment & Plan Note (Signed)
I will obtain aortic ultrasound next year.

## 2013-01-03 NOTE — Assessment & Plan Note (Signed)
A followup echocardiogram will be requested next year.

## 2013-01-03 NOTE — Patient Instructions (Addendum)
Continue same medications.  Can use Viagra 25 mg as need for erectile dysfunction.   Your physician wants you to follow-up in: 6 months.  You will receive a reminder letter in the mail two months in advance. If you don't receive a letter, please call our office to schedule the follow-up appointment.

## 2013-01-03 NOTE — Assessment & Plan Note (Signed)
The patient requested treatment for erectile dysfunction. His cardiac status has been stable and he is not on nitroglycerin. Thus, I agreed to provide him with a small dose Viagra 25 mg as needed.

## 2013-02-01 ENCOUNTER — Other Ambulatory Visit: Payer: Self-pay

## 2013-04-10 ENCOUNTER — Encounter (INDEPENDENT_AMBULATORY_CARE_PROVIDER_SITE_OTHER): Payer: Medicare Other

## 2013-04-10 DIAGNOSIS — I714 Abdominal aortic aneurysm, without rupture, unspecified: Secondary | ICD-10-CM

## 2013-04-20 ENCOUNTER — Ambulatory Visit (INDEPENDENT_AMBULATORY_CARE_PROVIDER_SITE_OTHER): Payer: Medicare Other | Admitting: Cardiovascular Disease

## 2013-04-20 ENCOUNTER — Encounter: Payer: Self-pay | Admitting: Cardiovascular Disease

## 2013-04-20 VITALS — BP 155/80 | HR 90 | Ht 69.0 in | Wt 165.0 lb

## 2013-04-20 DIAGNOSIS — I10 Essential (primary) hypertension: Secondary | ICD-10-CM

## 2013-04-20 DIAGNOSIS — I714 Abdominal aortic aneurysm, without rupture, unspecified: Secondary | ICD-10-CM

## 2013-04-20 DIAGNOSIS — I350 Nonrheumatic aortic (valve) stenosis: Secondary | ICD-10-CM

## 2013-04-20 DIAGNOSIS — I359 Nonrheumatic aortic valve disorder, unspecified: Secondary | ICD-10-CM

## 2013-04-20 DIAGNOSIS — R079 Chest pain, unspecified: Secondary | ICD-10-CM

## 2013-04-20 DIAGNOSIS — I2510 Atherosclerotic heart disease of native coronary artery without angina pectoris: Secondary | ICD-10-CM

## 2013-04-20 NOTE — Progress Notes (Signed)
HPI  This is a 80 year old male who is here today for a followup visit. He has known history of coronary artery disease status post two-vessel CABG in 2008 at [facility]. He is also known to have moderate size abdominal aortic aneurysm, previous left carotid endarterectomy, hypertension, hyperlipidemia, mild to moderate aortic stenosis and tobacco use. He has left bundle branch block with intermittent second degree Mobitz type I.  He did not tolerate treatment with carvedilol in the past due to worsening dyspnea and fatigue. Most recent echocardiogram in September of 2013 showed reduced LV systolic function with an EF of 40-45% with mild to moderate aortic stenosis. He continues to receive chemotherapy for stage IV squamous cell carcinoma of the lung. He still has 2 more sessions. His response to cancer treatment is still not known. He has been doing reasonably well. He has had some worsening shortness of breath with chemotherapy. Recent abdominal ultrasound showed that the aortic aneurysm grew to 4.9 cm. He denies abdominal pain.  Allergies  Allergen Reactions  . Tarka [Trandolapril-Verapamil Hcl Er]      Current Outpatient Prescriptions on File Prior to Visit  Medication Sig Dispense Refill  . aspirin 81 MG tablet Take 81 mg by mouth daily.      . Cholecalciferol (VITAMIN D-3 PO) Take 400 Units by mouth daily.      . Coenzyme Q10 (CO Q 10) 100 MG CAPS Take by mouth daily.      Marland Kitchen Dextromethorphan-Guaifenesin (VICKS DAYQUIL MUCUS CONTROL DM PO) Take by mouth as needed.      Javier Docker Oil 500 MG CAPS Take by mouth daily.      Marland Kitchen losartan (COZAAR) 50 MG tablet Take 1 tablet (50 mg total) by mouth daily.  30 tablet  9  . Multiple Vitamins-Minerals (CENTRUM SILVER PO) Take by mouth daily.      Marland Kitchen oxyCODONE (OXYCONTIN) 10 MG 12 hr tablet Take 10 mg by mouth as needed for pain.      . rosuvastatin (CRESTOR) 20 MG tablet Take 20 mg by mouth daily.      Marland Kitchen zolpidem (AMBIEN) 10 MG tablet Take 10 mg by  mouth daily.       No current facility-administered medications on file prior to visit.     Past Medical History  Diagnosis Date  . Carotid artery occlusion   . Coronary artery disease   . Hyperlipidemia   . AAA (abdominal aortic aneurysm) 2010  . Hypertension   . Aortic stenosis     Mild to moderate with a mean gradient of 14 mm Hg and valve area of 1.1 (October of 2013)  . Cardiomyopathy     EF 40-45%  . Cancer     lung cancer     Past Surgical History  Procedure Laterality Date  . Cardiac catheterization    . Carotid endarterectomy  2005    Dr. Pat Patrick  . Knee surgery      left  . Cataract extraction    . Colonoscopy    . Surgical injection spine    . Burn facial cancer    . Coronary artery bypass graft  2008    [facility] x2: LIMA to LAD and SVG to RCA.  Marland Kitchen Porta cath      CHEST     Family History  Problem Relation Age of Onset  . Family history unknown: Yes     History   Social History  . Marital Status: Single  Spouse Name: N/A    Number of Children: N/A  . Years of Education: N/A   Occupational History  . Not on file.   Social History Main Topics  . Smoking status: Former Smoker -- 0.50 packs/day for 70 years    Types: Cigarettes  . Smokeless tobacco: Not on file  . Alcohol Use: No  . Drug Use: No  . Sexual Activity: Not on file   Other Topics Concern  . Not on file   Social History Narrative  . No narrative on file      PHYSICAL EXAM   BP 155/80  Pulse 90  Ht 5\' 9"  (1.753 m)  Wt 165 lb (74.844 kg)  BMI 24.36 kg/m2 Constitutional: He is oriented to person, place, and time. He appears well-developed and well-nourished. No distress.  HENT: No nasal discharge.  Head: Normocephalic and atraumatic.  Eyes: Pupils are equal and round. Right eye exhibits no discharge. Left eye exhibits no discharge.  Neck: Normal range of motion. Neck supple. No JVD present. No thyromegaly present.  Cardiovascular: Normal rate, regular rhythm, normal heart  sounds and. Exam reveals no gallop and no friction rub. There is a 2/6 systolic ejection murmur at the aortic area with preserved S2.  Pulmonary/Chest: Effort normal and breath sounds normal. No stridor. No respiratory distress. He has no wheezes. He has no rales. He exhibits no tenderness.  Abdominal: Soft. Bowel sounds are normal. He exhibits no distension. There is no tenderness. There is no rebound and no guarding.  Musculoskeletal: Normal range of motion. He exhibits trace edema and no tenderness.  Neurological: He is alert and oriented to person, place, and time. Coordination normal.  Skin: Skin is warm and dry. No rash noted. He is not diaphoretic. No erythema. No pallor.  Psychiatric: He has a normal mood and affect. His behavior is normal. Judgment and thought content normal.     EKG: Sinus  Rhythm with first degree AV block -Left bundle branch block.   ABNORMAL    ASSESSMENT AND PLAN

## 2013-04-20 NOTE — Patient Instructions (Signed)
Your physician has requested that you have an abdominal aorta duplex. During this test, an ultrasound is used to evaluate the aorta. Allow 30 minutes for this exam. Do not eat after midnight the day before and avoid carbonated beverages Please schedule in July.  Your physician wants you to follow-up in: 1 week after your test.  You will receive a reminder letter in the mail two months in advance. If you don't receive a letter, please call our office to schedule the follow-up appointment.   Your physician recommends that you continue on your current medications as directed. Please refer to the Current Medication list given to you today.

## 2013-04-21 ENCOUNTER — Encounter: Payer: Self-pay | Admitting: Cardiovascular Disease

## 2013-04-21 NOTE — Assessment & Plan Note (Signed)
Blood pressure is elevated today. However, he seems to be anxious. He reports that his recent blood pressure has been running around 115 to 117 mm mercury systolic.

## 2013-04-21 NOTE — Assessment & Plan Note (Signed)
A followup echocardiogram will be requested during his next visit.

## 2013-04-21 NOTE — Assessment & Plan Note (Signed)
His abdominal aortic aneurysm grew by more than 5 mm over the last 6 months. Currently it measures 4.9 cm. Ideally, this should be repaired now. However, he is getting chemotherapy for stage IV lung cancer. The long-term prognosis of his cancer is not known yet. Thus, I don't think it makes sense to proceed with treatment of his aortic aneurysm. I explained to him the risk of rupture. He understands this and also wants to wait and not do anything about this until his cancer treatment is finished. Repeat ultrasound in 6 months.

## 2013-04-21 NOTE — Assessment & Plan Note (Signed)
He had some worsening of dyspnea with chemotherapy. Likely related to his lung cancer.

## 2013-04-25 ENCOUNTER — Telehealth: Payer: Self-pay

## 2013-04-25 DIAGNOSIS — E785 Hyperlipidemia, unspecified: Secondary | ICD-10-CM

## 2013-04-25 DIAGNOSIS — R0989 Other specified symptoms and signs involving the circulatory and respiratory systems: Secondary | ICD-10-CM

## 2013-04-25 DIAGNOSIS — I10 Essential (primary) hypertension: Secondary | ICD-10-CM

## 2013-04-25 DIAGNOSIS — I25810 Atherosclerosis of coronary artery bypass graft(s) without angina pectoris: Secondary | ICD-10-CM

## 2013-04-25 NOTE — Telephone Encounter (Signed)
Pt wife states that pt right leg is swollen, painful to touch. Please call. Pt wife states she called the "nurse line" and they suggested she call pt PCP.      Patient stated she has a call into her PCP about this and she called [place] by mistake. Informed patient to call us back if she did not hear from pcp.

## 2013-04-25 NOTE — Telephone Encounter (Signed)
Pt wife states that pt right leg is swollen, painful to touch. Please call. Pt wife states she called the "nurse line" and they suggested she call pt PCP. Please call.

## 2013-04-30 MED ORDER — LOSARTAN POTASSIUM 50 MG PO TABS: 50.0000 mg | ORAL_TABLET | Freq: Every day | ORAL | Status: AC

## 2013-04-30 NOTE — Telephone Encounter (Signed)
Schedule right lower extremity venous duplex ASAP.

## 2013-04-30 NOTE — Telephone Encounter (Signed)
Called pt to check on him.  He reports that "it finally subsided" and he is "pretty sure that it was a blood clot". Pt reports that he did not see his PCP or any physician, as it went away on it's own. Discussed w/ pt the importance of addressing sx immediately. Pt would like to know if Dr. Fletcher Anon would like to order an u/s or any other tests to evaluate his legs, as he is ready to do so.  Please advise.  Thank you.

## 2013-04-30 NOTE — Telephone Encounter (Signed)
Spoke w/ pt and his wife, as they were both on the line.  He is agreeable to having the u/s and is scheduled tomorrow at 4:30. Pt has chemo tomorrow am and will call if this interferes.

## 2013-05-01 ENCOUNTER — Encounter (INDEPENDENT_AMBULATORY_CARE_PROVIDER_SITE_OTHER): Payer: Medicare Other

## 2013-05-01 DIAGNOSIS — R0989 Other specified symptoms and signs involving the circulatory and respiratory systems: Secondary | ICD-10-CM

## 2013-05-01 DIAGNOSIS — M7989 Other specified soft tissue disorders: Secondary | ICD-10-CM

## 2013-05-01 DIAGNOSIS — I25810 Atherosclerosis of coronary artery bypass graft(s) without angina pectoris: Secondary | ICD-10-CM

## 2013-05-01 DIAGNOSIS — I10 Essential (primary) hypertension: Secondary | ICD-10-CM

## 2013-05-01 DIAGNOSIS — E785 Hyperlipidemia, unspecified: Secondary | ICD-10-CM

## 2013-05-31 ENCOUNTER — Telehealth: Payer: Self-pay | Admitting: *Deleted

## 2013-05-31 DIAGNOSIS — E785 Hyperlipidemia, unspecified: Secondary | ICD-10-CM

## 2013-05-31 NOTE — Telephone Encounter (Signed)
Patient's wife called requesting a cholestrol blood test. Patient doesn't want to go throught pcp due to all the sickness. P

## 2013-05-31 NOTE — Telephone Encounter (Signed)
That's fine. Do fasting lipid and liver profile.

## 2013-05-31 NOTE — Telephone Encounter (Signed)
Patient wanted to know if we can check his cholesterol. He said he would go to his PCP but it is always crowded and hard to get there and he has been very sick from his chemo treatment.

## 2013-06-04 ENCOUNTER — Ambulatory Visit (INDEPENDENT_AMBULATORY_CARE_PROVIDER_SITE_OTHER): Payer: Medicare Other

## 2013-06-04 DIAGNOSIS — E785 Hyperlipidemia, unspecified: Secondary | ICD-10-CM

## 2013-06-05 ENCOUNTER — Telehealth: Payer: Self-pay | Admitting: *Deleted

## 2013-06-05 LAB — LIPID PANEL
Chol/HDL Ratio: 2.7 ratio units (ref 0.0–5.0)
Cholesterol, Total: 114 mg/dL (ref 100–199)
HDL: 42 mg/dL (ref >39)
LDL Calculated: 58 mg/dL (ref 0–99)
Triglycerides: 69 mg/dL (ref 0–149)
VLDL Cholesterol Cal: 14 mg/dL (ref 5–40)

## 2013-06-05 LAB — HEPATIC FUNCTION PANEL
ALK PHOS: 106 IU/L (ref 39–117)
ALT: 57 IU/L — ABNORMAL HIGH (ref 0–44)
AST: 62 IU/L — ABNORMAL HIGH (ref 0–40)
Albumin: 3.7 g/dL (ref 3.5–4.7)
BILIRUBIN DIRECT: 0.15 mg/dL (ref 0.00–0.40)
Total Bilirubin: 0.4 mg/dL (ref 0.0–1.2)
Total Protein: 6.3 g/dL (ref 6.0–8.5)

## 2013-06-05 MED ORDER — ROSUVASTATIN CALCIUM 20 MG PO TABS: 10.0000 mg | ORAL_TABLET | Freq: Every day | ORAL | Status: AC

## 2013-06-05 NOTE — Telephone Encounter (Signed)
Message copied by Tracie Harrier on Tue Jun 05, 2013  8:22 AM ------      Message from: Kathlyn Sacramento A      Created: Tue Jun 05, 2013  7:44 AM       Slightly abnormal liver test. Cholesterol was excellent. Decrease Crestor to 10 mg daily. ------

## 2013-06-18 ENCOUNTER — Telehealth: Payer: Self-pay | Admitting: *Deleted

## 2013-06-18 NOTE — Telephone Encounter (Signed)
Scheduled patient to be seen tomorrow. Patient verbalized understanding. Instructed patient that if his situation became emergent between now and appt time to go to the ED.

## 2013-06-18 NOTE — Telephone Encounter (Signed)
Patient has been having chest pain for the last several months.  He states he it become worse the last week or so and is keeping him up at night.  The pain is located in his mid chest and wraps around his side to his back.  When is occurs it is a 5/10 It hurts often after he takes his oxycodone for back pain.  He denies any other symptoms. He is not in pain currently  He has no current appts  He wants to know if he should see Dr. Fletcher Anon about this?

## 2013-06-18 NOTE — Telephone Encounter (Signed)
The chest pain could be from his lung cancer but he should come for a follow up to get an ECG and check his cardiac status.

## 2013-06-19 ENCOUNTER — Ambulatory Visit (INDEPENDENT_AMBULATORY_CARE_PROVIDER_SITE_OTHER): Payer: Medicare Other | Admitting: Cardiovascular Disease

## 2013-06-19 ENCOUNTER — Encounter: Payer: Self-pay | Admitting: Cardiovascular Disease

## 2013-06-19 VITALS — BP 157/92 | HR 85 | Ht 69.0 in | Wt 160.2 lb

## 2013-06-19 DIAGNOSIS — I10 Essential (primary) hypertension: Secondary | ICD-10-CM

## 2013-06-19 DIAGNOSIS — I714 Abdominal aortic aneurysm, without rupture, unspecified: Secondary | ICD-10-CM

## 2013-06-19 DIAGNOSIS — R0781 Pleurodynia: Secondary | ICD-10-CM

## 2013-06-19 DIAGNOSIS — I2510 Atherosclerotic heart disease of native coronary artery without angina pectoris: Secondary | ICD-10-CM

## 2013-06-19 DIAGNOSIS — R0602 Shortness of breath: Secondary | ICD-10-CM

## 2013-06-19 DIAGNOSIS — I359 Nonrheumatic aortic valve disorder, unspecified: Secondary | ICD-10-CM

## 2013-06-19 DIAGNOSIS — R079 Chest pain, unspecified: Secondary | ICD-10-CM

## 2013-06-19 DIAGNOSIS — I350 Nonrheumatic aortic (valve) stenosis: Secondary | ICD-10-CM

## 2013-06-19 DIAGNOSIS — R071 Chest pain on breathing: Secondary | ICD-10-CM

## 2013-06-19 NOTE — Progress Notes (Signed)
HPI  This is a 80 year old male who is here today for a followup visit. He has known history of coronary artery disease status post two-vessel CABG in 2008 at [facility]. He is also known to have moderate size abdominal aortic aneurysm, previous left carotid endarterectomy, hypertension, hyperlipidemia, mild to moderate aortic stenosis and tobacco use. He has left bundle branch block with intermittent second degree Mobitz type I.  He did not tolerate treatment with carvedilol in the past due to worsening dyspnea and fatigue. Most recent echocardiogram in September of 2013 showed reduced LV systolic function with an EF of 40-45% with mild to moderate aortic stenosis. He continues to receive chemotherapy for stage IV squamous cell carcinoma of the lung. Recent abdominal ultrasound showed that the aortic aneurysm grew to 4.9 cm. He denies abdominal pain. He was added to my schedule due to chest pain. He has been having sharp chest discomfort which usually happens with lying down mostly at night. He also noticed it more when he was taking oxycodone. The pain is worse with deep breath. It's not related to physical activities. There is no chest tightness.  Allergies  Allergen Reactions  . Tarka [Trandolapril-Verapamil Hcl Er]      Current Outpatient Prescriptions on File Prior to Visit  Medication Sig Dispense Refill  . aspirin 81 MG tablet Take 81 mg by mouth daily.      . Cholecalciferol (VITAMIN D-3 PO) Take 400 Units by mouth daily.      . Coenzyme Q10 (CO Q 10) 100 MG CAPS Take by mouth daily.      Marland Kitchen Dextromethorphan-Guaifenesin (VICKS DAYQUIL MUCUS CONTROL DM PO) Take by mouth as needed.      Javier Docker Oil 500 MG CAPS Take by mouth daily.      Marland Kitchen losartan (COZAAR) 50 MG tablet Take 1 tablet (50 mg total) by mouth daily.  30 tablet  9  . Multiple Vitamins-Minerals (CENTRUM SILVER PO) Take by mouth daily.      . rosuvastatin (CRESTOR) 20 MG tablet Take 0.5 tablets (10 mg total) by mouth daily.        No current facility-administered medications on file prior to visit.     Past Medical History  Diagnosis Date  . Carotid artery occlusion   . Coronary artery disease   . Hyperlipidemia   . AAA (abdominal aortic aneurysm) 2010  . Hypertension   . Aortic stenosis     Mild to moderate with a mean gradient of 14 mm Hg and valve area of 1.1 (October of 2013)  . Cardiomyopathy     EF 40-45%  . Cancer     lung cancer     Past Surgical History  Procedure Laterality Date  . Cardiac catheterization    . Carotid endarterectomy  2005    Dr. Pat Patrick  . Knee surgery      left  . Cataract extraction    . Colonoscopy    . Surgical injection spine    . Burn facial cancer    . Coronary artery bypass graft  2008    [facility] x2: LIMA to LAD and SVG to RCA.  Marland Kitchen Porta cath      CHEST     History reviewed. No pertinent family history.   History   Social History  . Marital Status: Single    Spouse Name: N/A    Number of Children: N/A  . Years of Education: N/A   Occupational History  .  Not on file.   Social History Main Topics  . Smoking status: Former Smoker -- 0.50 packs/day for 70 years    Types: Cigarettes  . Smokeless tobacco: Not on file  . Alcohol Use: No  . Drug Use: No  . Sexual Activity: Not on file   Other Topics Concern  . Not on file   Social History Narrative  . No narrative on file      PHYSICAL EXAM   BP 157/92  Pulse 85  Ht 5\' 9"  (1.753 m)  Wt 160 lb 4 oz (72.689 kg)  BMI 23.65 kg/m2 Constitutional: He is oriented to person, place, and time. He appears well-developed and well-nourished. No distress.  HENT: No nasal discharge.  Head: Normocephalic and atraumatic.  Eyes: Pupils are equal and round. Right eye exhibits no discharge. Left eye exhibits no discharge.  Neck: Normal range of motion. Neck supple. No JVD present. No thyromegaly present.  Cardiovascular: Normal rate, regular rhythm, normal heart sounds and. Exam reveals no gallop and no  friction rub. There is a 2/6 systolic ejection murmur at the aortic area with preserved S2.  Pulmonary/Chest: Effort normal and breath sounds normal. No stridor. No respiratory distress. He has no wheezes. He has no rales. He exhibits no tenderness.  Abdominal: Soft. Bowel sounds are normal. He exhibits no distension. There is no tenderness. There is no rebound and no guarding.  Musculoskeletal: Normal range of motion. He exhibits trace edema and no tenderness.  Neurological: He is alert and oriented to person, place, and time. Coordination normal.  Skin: Skin is warm and dry. No rash noted. He is not diaphoretic. No erythema. No pallor.  Psychiatric: He has a normal mood and affect. His behavior is normal. Judgment and thought content normal.     EKG: Sinus  Rhythm with first degree AV block -Left bundle branch block.   ABNORMAL    ASSESSMENT AND PLAN

## 2013-06-19 NOTE — Patient Instructions (Signed)
Your physician has requested that you have an echocardiogram this week. Echocardiography is a painless test that uses sound waves to create images of your heart. It provides your doctor with information about the size and shape of your heart and how well your heart's chambers and valves are working. This procedure takes approximately one hour. There are no restrictions for this procedure.  Your physician recommends that you schedule a follow-up appointment in:  3 months

## 2013-06-21 ENCOUNTER — Encounter: Payer: Self-pay | Admitting: Cardiovascular Disease

## 2013-06-21 ENCOUNTER — Other Ambulatory Visit: Payer: Self-pay

## 2013-06-21 ENCOUNTER — Other Ambulatory Visit (INDEPENDENT_AMBULATORY_CARE_PROVIDER_SITE_OTHER): Payer: Medicare Other

## 2013-06-21 DIAGNOSIS — I2510 Atherosclerotic heart disease of native coronary artery without angina pectoris: Secondary | ICD-10-CM

## 2013-06-21 DIAGNOSIS — R0781 Pleurodynia: Secondary | ICD-10-CM | POA: Insufficient documentation

## 2013-06-21 DIAGNOSIS — R0602 Shortness of breath: Secondary | ICD-10-CM

## 2013-06-21 DIAGNOSIS — R079 Chest pain, unspecified: Secondary | ICD-10-CM

## 2013-06-21 DIAGNOSIS — I359 Nonrheumatic aortic valve disorder, unspecified: Secondary | ICD-10-CM

## 2013-06-21 NOTE — Assessment & Plan Note (Signed)
Blood pressure is elevated today. However, home blood pressure readings are ranging from 119-138/ 79-90. Continue current dose of losartan.

## 2013-06-21 NOTE — Assessment & Plan Note (Signed)
This will need to be evaluated once he is done with lung cancer treatment.

## 2013-06-21 NOTE — Assessment & Plan Note (Signed)
The chest pain is pleuritic in nature and does not seem to be anginal. Worsening with lying down is worrisome for possible pericardial effusion which is a possibility given his lung cancer. Thus, I ordered an echocardiogram for evaluation. Pulmonary embolism is another possibility. However, he has no associated dyspnea.

## 2013-06-21 NOTE — Assessment & Plan Note (Signed)
Still does not seem to be severe by physical exam that will be evaluated with echocardiogram.

## 2013-06-21 NOTE — Assessment & Plan Note (Signed)
No symptoms of angina. Continue medical therapy.

## 2013-06-22 ENCOUNTER — Telehealth: Payer: Self-pay

## 2013-06-22 ENCOUNTER — Other Ambulatory Visit: Payer: Self-pay | Admitting: Emergency Medicine

## 2013-06-22 ENCOUNTER — Other Ambulatory Visit: Payer: Self-pay

## 2013-06-22 NOTE — Telephone Encounter (Signed)
Ok

## 2013-06-22 NOTE — Telephone Encounter (Signed)
Pt wife called and states pt has a lot of pain in his chest, would also like echo results. Pt wife states he needs something for pain. Please call.

## 2013-06-22 NOTE — Telephone Encounter (Signed)
Patients wife called and said that patient was up all night with chest pain She asked if she could get something for pain or should she take him to the ED?  Patient states he was in "severe" pain last night. He was short of breath and nauseas  His pain started in his "left upper chest and goes to the middle chest and around his back".  I instructed patients wife to get patient to the ED right away via EMS if necessary  Patient and wife verbalized understanding

## 2013-09-07 ENCOUNTER — Other Ambulatory Visit: Payer: Self-pay | Admitting: Emergency Medicine

## 2013-09-20 ENCOUNTER — Ambulatory Visit: Payer: Medicare Other | Admitting: Cardiovascular Disease

## 2013-09-26 NOTE — Telephone Encounter (Signed)
This encounter was created in error - please disregard.

## 2013-09-26 DEATH — deceased

## 2015-07-07 IMAGING — PT NM PET TUM IMG RESTAG (PS) SKULL BASE T - THIGH
10 series · 24 of 25 positions shown · non-contrast
Comparison: NM PET JAMES THOMAS GUMBLEY (PS) SKULL BASE T - THIGH dated
02/05/2013

CLINICAL DATA: Subsequent treatment strategy for restaging of stage
IV lung cancer..

EXAM:
NUCLEAR MEDICINE PET SKULL BASE TO THIGH
FASTING BLOOD GLUCOSE:  Value: 93 mg/dl
TECHNIQUE: 12.0 mCi F-18 FDG was injected intravenously. Full-ring PET imaging
was performed from the skull base to thigh after the radiotracer. CT
data was obtained and used for attenuation correction and anatomic
localization.

[Series 3: ct wb 5.0 b30f · axial · 5.0mm · 0.98mm/px · z∈[-1352,-486]mm · 3 of 290 slices shown]
[im 1/290]
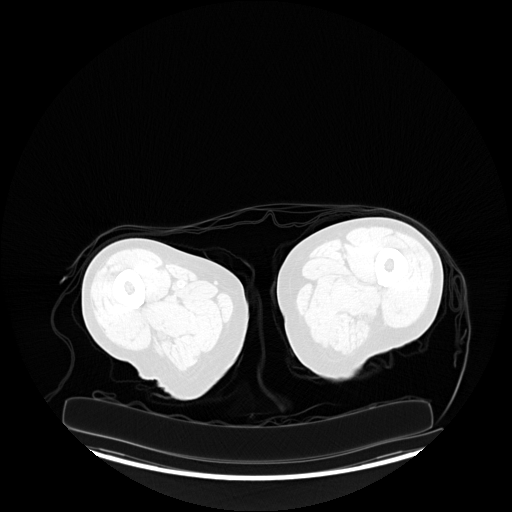
[im 145/290]
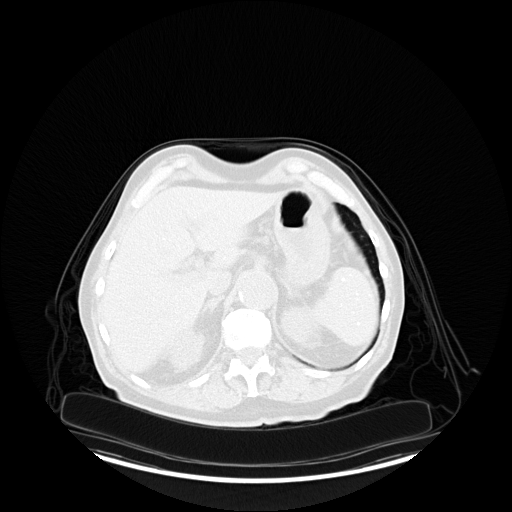
[im 290/290  brain]
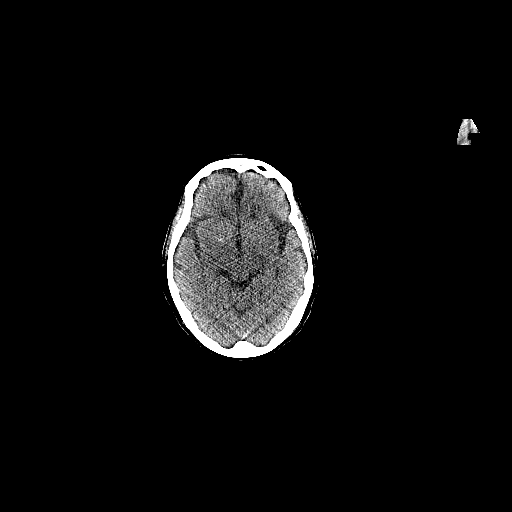

[Series 4: pet wb (ac) · axial · 5.0mm · 4.07mm/px · z∈[-1352,-486]mm · 3 of 290 slices shown]
[im 1/290]
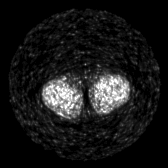
[im 145/290]
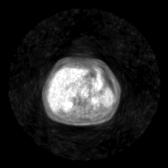
[im 290/290]
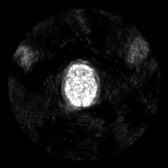

[Series 5: pet wb uncorrected (nac) · axial · 5.0mm · 4.07mm/px · z∈[-1352,-486]mm · 4 of 290 slices shown]
[im 1/290]
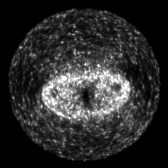
[im 97/290]
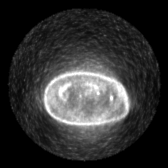
[im 193/290]
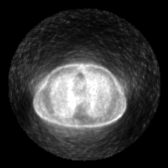
[im 290/290]
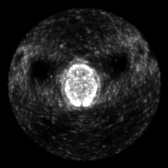

[Series 603: pet fused axial · 3 of 290 slices shown]
[im 1/290]
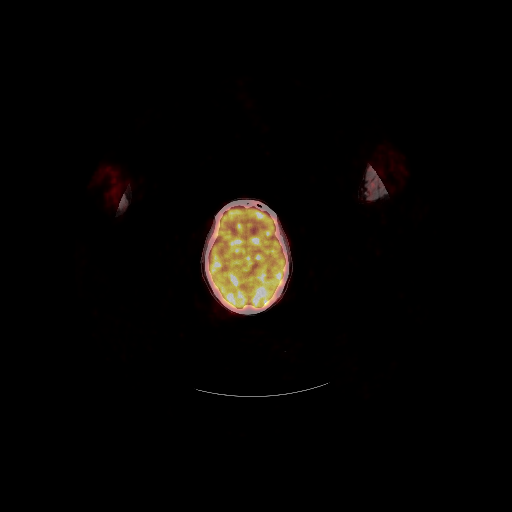
[im 97/290]
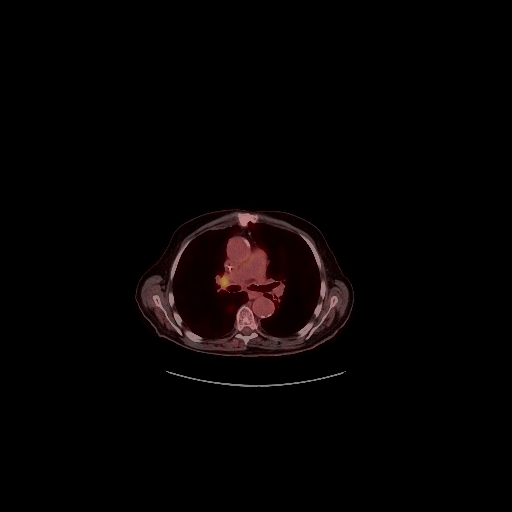
[im 290/290]
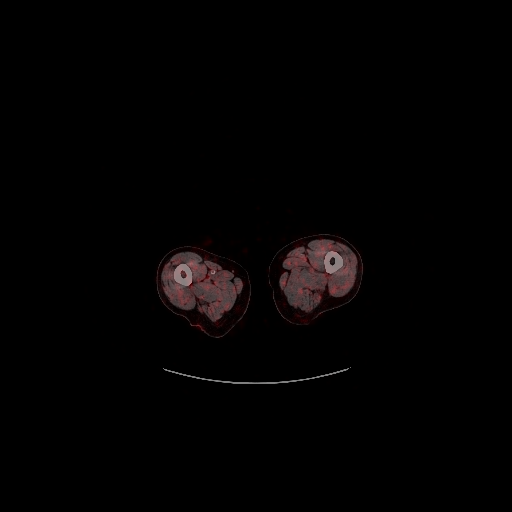

[Series 604: pet fused coronal · 1 of 99 slices shown]
[im 1/99]
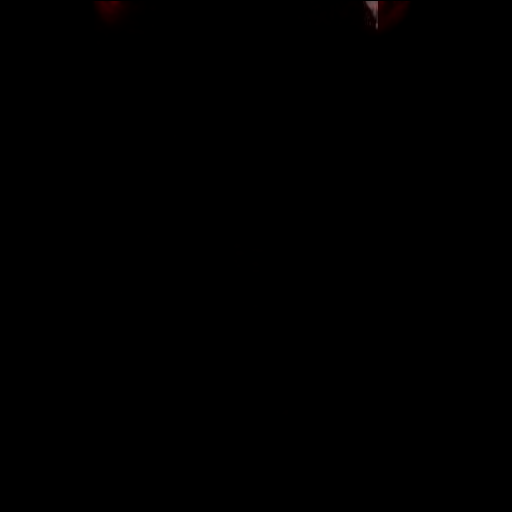

[Series 605: pet fused sagittal · 2 of 158 slices shown]
[im 1/158]
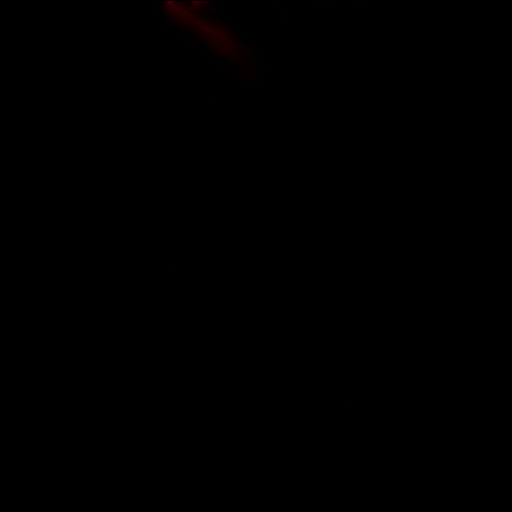
[im 158/158]
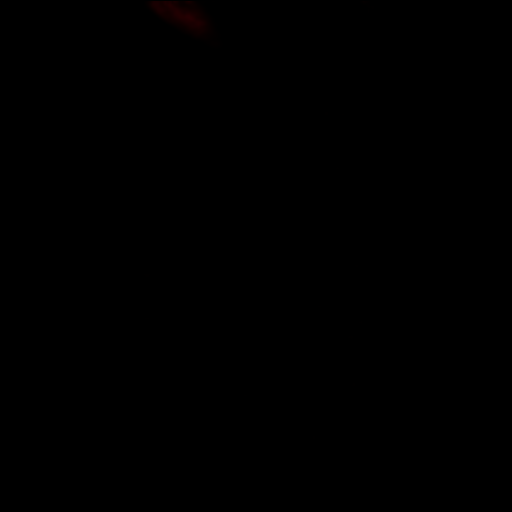

[Series 606: pet axial · 4 of 290 slices shown]
[im 1/290]
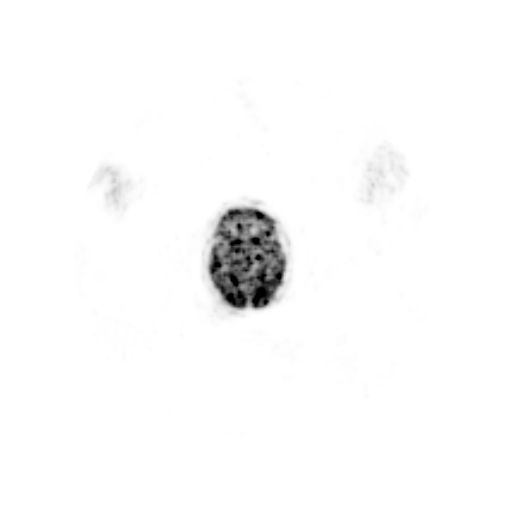
[im 97/290]
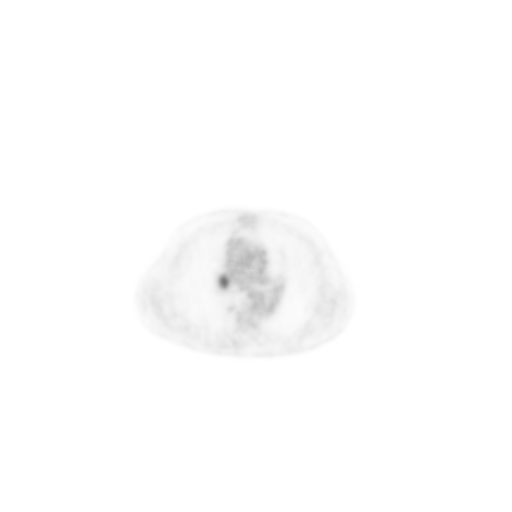
[im 193/290]
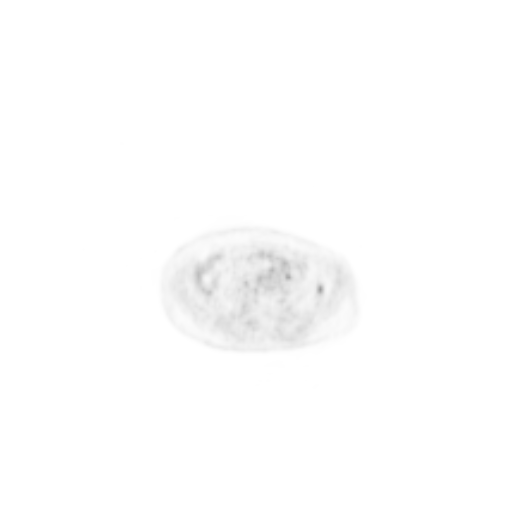
[im 290/290]
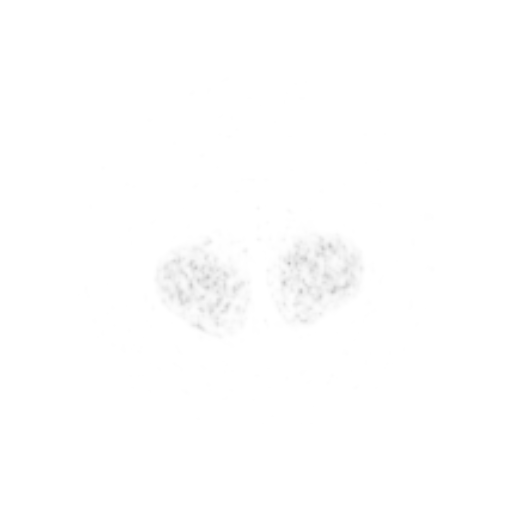

[Series 607: pet coronal · 1 of 102 slices shown]
[im 1/102]
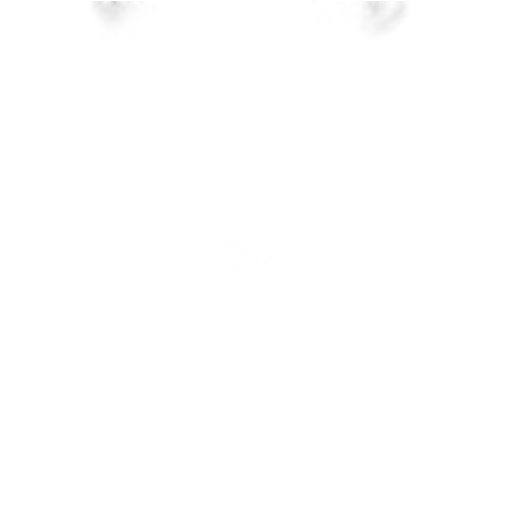

[Series 608: pet sagittal · 2 of 168 slices shown]
[im 1/168]
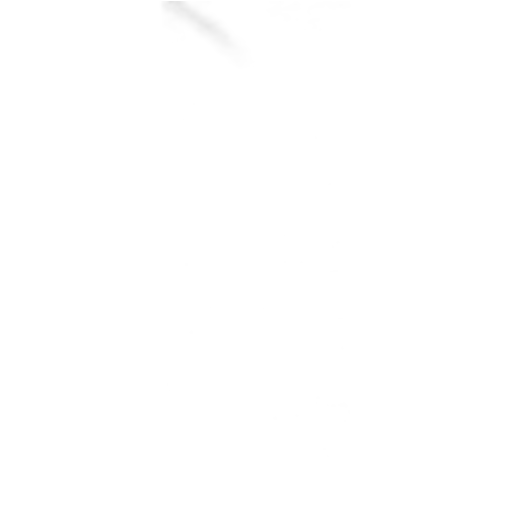
[im 168/168]
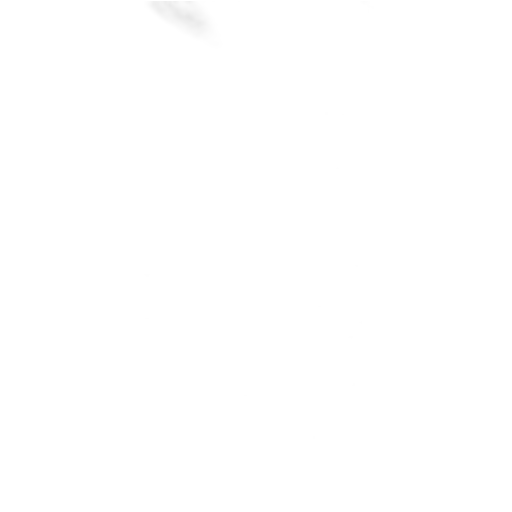

[results mm oncology reading · 1.33mm/px · 1 of 8 slices shown]
[im 1/8]
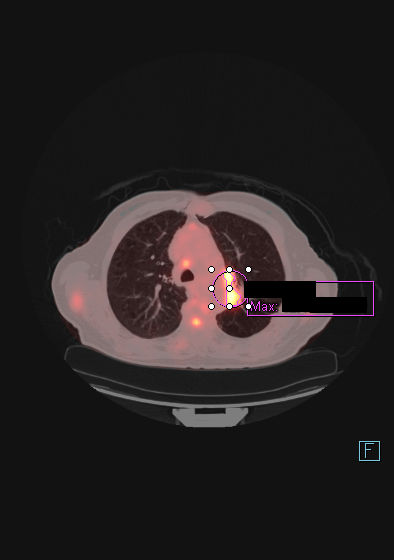

[24 of 25 positions shown; findings below may reference images not displayed]

FINDINGS: NECK

No areas of abnormal hypermetabolism.

CHEST

Posterior left upper lobe lung mass measures 3.8 x 3.3 cm and a
S.U.V. max of 12.5 on image 79. Similar in size on the prior exam,
and measured a S.U.V. max of 13.7.

Hypermetabolic right hilar adenopathy measures a S.U.V. max of 5.3 .
On the prior exam, this measured a S.U.V. max of 14.2. Subcarinal
hypermetabolic adenopathy is also decreased.

Pleural based right lower lobe hypermetabolic lung mass measures
cm and a S.U.V. max of 18.6 on image 111. On the prior exam, this
measured a S.U.V. max of 17.5 and 3.9 cm.

ABDOMEN/PELVIS

Subtle hypermetabolism suspected within the high central right lobe
of the liver. No definite CT correlate. This measures a S.U.V. max
of 5.0 on approximately image 140.

Subtle right adrenal nodularity and hypermetabolism. Measures a
S.U.V. max of 3.1 on image 147. New.

SKELETON

Redemonstration of osseous metastasis. Index lesion within the
right-sided L5 vertebral body measures a S.U.V. max of 17.4 today.
This is increased from a S.U.V. max of 9.5 on the prior. In
addition, a new focus of hypermetabolism in the right iliac is
suspicious for osseous metastasis. Not well visualized on CT.

Anterior right rib focus of hypermetabolism measures a S.U.V. max of
4.6 at approximately the fifth anterior right rib.

CT IMAGES PERFORMED FOR ATTENUATION CORRECTION

Right maxillary sinus mucous retention cyst or polyp.

A right-sided Port-A-Cath which terminates at the mid SVC. Prior
median sternotomy. Centrilobular emphysema. Remote Granulomatous
disease in the liver and spleen. Infrarenal abdominal aortic
aneurysm. Similar to the prior exam of maximally 4.3 cm. Redundant
urinary bladder suggests a component of bladder outlet obstruction.
Probable prostatectomy.
IMPRESSION: 1. Mixed response to therapy.
2. Improvement in thoracic adenopathy with similar left upper lobe
mass.
3. Enlargement of a hypermetabolic right lower lobe lung mass.
4. Mild progression of osseous metastasis as evidenced by at least 1
new lesion.
5. Suspicion of right adrenal metastasis, new.
6. Cannot exclude early hepatic metastasis. Subtle hypermetabolism
without definite CT correlate. Recommend either attention on
follow-up or further characterization with abdominal MRI.
7. Similar infrarenal aortic aneurysm.
